# Patient Record
Sex: Male | Born: 1945
Health system: Southern US, Community
[De-identification: ages and names within clinical notes are randomized; demographics above are authoritative.]

---

## 2017-01-17 ENCOUNTER — Other Ambulatory Visit (HOSPITAL_COMMUNITY): Payer: Self-pay

## 2017-01-17 ENCOUNTER — Inpatient Hospital Stay
Admission: RE | Admit: 2017-01-17 | Discharge: 2017-02-27 | Disposition: A | Discharge status: Skilled Nursing Facility | Payer: Self-pay | Attending: Internal Medicine | Admitting: Internal Medicine

## 2017-01-17 DIAGNOSIS — Z30430 Encounter for insertion of intrauterine contraceptive device: Secondary | ICD-10-CM

## 2017-01-17 DIAGNOSIS — Z4659 Encounter for fitting and adjustment of other gastrointestinal appliance and device: Secondary | ICD-10-CM

## 2017-01-17 DIAGNOSIS — Z0189 Encounter for other specified special examinations: Secondary | ICD-10-CM

## 2017-01-17 DIAGNOSIS — R042 Hemoptysis: Secondary | ICD-10-CM

## 2017-01-17 DIAGNOSIS — J80 Acute respiratory distress syndrome: Secondary | ICD-10-CM

## 2017-01-17 DIAGNOSIS — J9690 Respiratory failure, unspecified, unspecified whether with hypoxia or hypercapnia: Secondary | ICD-10-CM

## 2017-01-17 DIAGNOSIS — J189 Pneumonia, unspecified organism: Secondary | ICD-10-CM

## 2017-01-17 DIAGNOSIS — K567 Ileus, unspecified: Secondary | ICD-10-CM

## 2017-01-17 DIAGNOSIS — Z978 Presence of other specified devices: Secondary | ICD-10-CM

## 2017-01-17 LAB — BLOOD GAS, ARTERIAL
ACID-BASE EXCESS: 4.9 mmol/L — AB (ref 0.0–2.0)
Bicarbonate: 28.4 mmol/L — ABNORMAL HIGH (ref 20.0–28.0)
FIO2: 40
O2 SAT: 89.7 %
PATIENT TEMPERATURE: 99.6
PH ART: 7.475 — AB (ref 7.350–7.450)
pCO2 arterial: 39.3 mmHg (ref 32.0–48.0)
pO2, Arterial: 60.9 mmHg — ABNORMAL LOW (ref 83.0–108.0)

## 2017-01-18 LAB — CBC WITH DIFFERENTIAL/PLATELET
BASOS PCT: 0 %
Basophils Absolute: 0 10*3/uL (ref 0.0–0.1)
EOS ABS: 0.2 10*3/uL (ref 0.0–0.7)
Eosinophils Relative: 2 %
HEMATOCRIT: 30 % — AB (ref 39.0–52.0)
HEMOGLOBIN: 9.4 g/dL — AB (ref 13.0–17.0)
Lymphocytes Relative: 5 %
Lymphs Abs: 0.6 10*3/uL — ABNORMAL LOW (ref 0.7–4.0)
MCH: 30.3 pg (ref 26.0–34.0)
MCHC: 31.3 g/dL (ref 30.0–36.0)
MCV: 96.8 fL (ref 78.0–100.0)
MONOS PCT: 10 %
Monocytes Absolute: 1.3 10*3/uL — ABNORMAL HIGH (ref 0.1–1.0)
NEUTROS ABS: 10.8 10*3/uL — AB (ref 1.7–7.7)
NEUTROS PCT: 83 %
Platelets: 308 10*3/uL (ref 150–400)
RBC: 3.1 MIL/uL — AB (ref 4.22–5.81)
RDW: 14.4 % (ref 11.5–15.5)
WBC: 12.9 10*3/uL — AB (ref 4.0–10.5)

## 2017-01-18 LAB — COMPREHENSIVE METABOLIC PANEL
ALK PHOS: 73 U/L (ref 38–126)
ALT: 97 U/L — ABNORMAL HIGH (ref 17–63)
ANION GAP: 5 (ref 5–15)
AST: 53 U/L — ABNORMAL HIGH (ref 15–41)
Albumin: 2.6 g/dL — ABNORMAL LOW (ref 3.5–5.0)
BUN: 21 mg/dL — ABNORMAL HIGH (ref 6–20)
CALCIUM: 8.3 mg/dL — AB (ref 8.9–10.3)
CHLORIDE: 107 mmol/L (ref 101–111)
CO2: 27 mmol/L (ref 22–32)
CREATININE: 1.08 mg/dL (ref 0.61–1.24)
Glucose, Bld: 143 mg/dL — ABNORMAL HIGH (ref 65–99)
Potassium: 4.3 mmol/L (ref 3.5–5.1)
SODIUM: 139 mmol/L (ref 135–145)
Total Bilirubin: 0.8 mg/dL (ref 0.3–1.2)
Total Protein: 5.4 g/dL — ABNORMAL LOW (ref 6.5–8.1)

## 2017-01-19 LAB — HEMOGLOBIN A1C
Hgb A1c MFr Bld: 5.5 % (ref 4.8–5.6)
Mean Plasma Glucose: 111 mg/dL

## 2017-01-21 LAB — BLOOD GAS, ARTERIAL
ACID-BASE EXCESS: 4.4 mmol/L — AB (ref 0.0–2.0)
BICARBONATE: 28.2 mmol/L — AB (ref 20.0–28.0)
FIO2: 35
O2 Saturation: 96.5 %
PCO2 ART: 41 mmHg (ref 32.0–48.0)
Patient temperature: 98.6
pH, Arterial: 7.452 — ABNORMAL HIGH (ref 7.350–7.450)
pO2, Arterial: 84.2 mmHg (ref 83.0–108.0)

## 2017-01-22 LAB — CULTURE, RESPIRATORY

## 2017-01-22 LAB — CULTURE, RESPIRATORY W GRAM STAIN: Culture: NORMAL

## 2017-01-23 ENCOUNTER — Other Ambulatory Visit (HOSPITAL_COMMUNITY): Payer: Self-pay

## 2017-01-23 LAB — GRAM STAIN

## 2017-01-23 LAB — BASIC METABOLIC PANEL
Anion gap: 10 (ref 5–15)
BUN: 14 mg/dL (ref 6–20)
CHLORIDE: 106 mmol/L (ref 101–111)
CO2: 23 mmol/L (ref 22–32)
Calcium: 8.6 mg/dL — ABNORMAL LOW (ref 8.9–10.3)
Creatinine, Ser: 1.22 mg/dL (ref 0.61–1.24)
GFR calc non Af Amer: 58 mL/min — ABNORMAL LOW (ref >60)
GLUCOSE: 107 mg/dL — AB (ref 65–99)
Potassium: 3.8 mmol/L (ref 3.5–5.1)
Sodium: 139 mmol/L (ref 135–145)

## 2017-01-23 LAB — CBC WITH DIFFERENTIAL/PLATELET
BASOS ABS: 0 10*3/uL (ref 0.0–0.1)
BASOS PCT: 1 %
Eosinophils Absolute: 0.2 10*3/uL (ref 0.0–0.7)
Eosinophils Relative: 2 %
HEMATOCRIT: 33 % — AB (ref 39.0–52.0)
HEMOGLOBIN: 10.2 g/dL — AB (ref 13.0–17.0)
LYMPHS PCT: 8 %
Lymphs Abs: 0.7 10*3/uL (ref 0.7–4.0)
MCH: 29.7 pg (ref 26.0–34.0)
MCHC: 30.9 g/dL (ref 30.0–36.0)
MCV: 95.9 fL (ref 78.0–100.0)
MONOS PCT: 10 %
Monocytes Absolute: 0.8 10*3/uL (ref 0.1–1.0)
NEUTROS ABS: 7 10*3/uL (ref 1.7–7.7)
NEUTROS PCT: 79 %
Platelets: 276 10*3/uL (ref 150–400)
RBC: 3.44 MIL/uL — ABNORMAL LOW (ref 4.22–5.81)
RDW: 14.3 % (ref 11.5–15.5)
WBC: 8.7 10*3/uL (ref 4.0–10.5)

## 2017-01-25 ENCOUNTER — Other Ambulatory Visit (HOSPITAL_COMMUNITY): Payer: Self-pay

## 2017-01-25 LAB — ACID FAST SMEAR (AFB, MYCOBACTERIA): Acid Fast Smear: NEGATIVE

## 2017-01-25 LAB — ACID FAST SMEAR (AFB)

## 2017-01-27 ENCOUNTER — Other Ambulatory Visit (HOSPITAL_COMMUNITY): Payer: Self-pay

## 2017-01-30 ENCOUNTER — Other Ambulatory Visit (HOSPITAL_COMMUNITY): Payer: Self-pay

## 2017-01-31 ENCOUNTER — Other Ambulatory Visit (HOSPITAL_COMMUNITY): Payer: Self-pay

## 2017-01-31 LAB — BASIC METABOLIC PANEL
Anion gap: 10 (ref 5–15)
BUN: 18 mg/dL (ref 6–20)
CALCIUM: 8.8 mg/dL — AB (ref 8.9–10.3)
CHLORIDE: 100 mmol/L — AB (ref 101–111)
CO2: 29 mmol/L (ref 22–32)
Creatinine, Ser: 1.07 mg/dL (ref 0.61–1.24)
GFR calc Af Amer: >60 mL/min (ref >60)
Glucose, Bld: 107 mg/dL — ABNORMAL HIGH (ref 65–99)
POTASSIUM: 3.3 mmol/L — AB (ref 3.5–5.1)
Sodium: 139 mmol/L (ref 135–145)

## 2017-02-01 ENCOUNTER — Other Ambulatory Visit (HOSPITAL_COMMUNITY): Payer: Self-pay

## 2017-02-02 LAB — CBC WITH DIFFERENTIAL/PLATELET
BASOS ABS: 0 10*3/uL (ref 0.0–0.1)
Basophils Relative: 0 %
EOS ABS: 0.4 10*3/uL (ref 0.0–0.7)
EOS PCT: 5 %
HCT: 36.8 % — ABNORMAL LOW (ref 39.0–52.0)
Hemoglobin: 12 g/dL — ABNORMAL LOW (ref 13.0–17.0)
LYMPHS PCT: 8 %
Lymphs Abs: 0.6 10*3/uL — ABNORMAL LOW (ref 0.7–4.0)
MCH: 30.6 pg (ref 26.0–34.0)
MCHC: 32.6 g/dL (ref 30.0–36.0)
MCV: 93.9 fL (ref 78.0–100.0)
MONO ABS: 1.2 10*3/uL — AB (ref 0.1–1.0)
Monocytes Relative: 18 %
Neutro Abs: 4.6 10*3/uL (ref 1.7–7.7)
Neutrophils Relative %: 69 %
PLATELETS: 212 10*3/uL (ref 150–400)
RBC: 3.92 MIL/uL — AB (ref 4.22–5.81)
RDW: 14.2 % (ref 11.5–15.5)
WBC: 6.8 10*3/uL (ref 4.0–10.5)

## 2017-02-02 LAB — BASIC METABOLIC PANEL
Anion gap: 7 (ref 5–15)
BUN: 25 mg/dL — AB (ref 6–20)
CALCIUM: 8.8 mg/dL — AB (ref 8.9–10.3)
CO2: 31 mmol/L (ref 22–32)
CREATININE: 1.02 mg/dL (ref 0.61–1.24)
Chloride: 101 mmol/L (ref 101–111)
GFR calc Af Amer: >60 mL/min (ref >60)
GLUCOSE: 159 mg/dL — AB (ref 65–99)
Potassium: 3.4 mmol/L — ABNORMAL LOW (ref 3.5–5.1)
SODIUM: 139 mmol/L (ref 135–145)

## 2017-02-05 LAB — CULTURE, RESPIRATORY W GRAM STAIN

## 2017-02-05 LAB — CULTURE, RESPIRATORY

## 2017-02-09 ENCOUNTER — Other Ambulatory Visit (HOSPITAL_COMMUNITY): Payer: Self-pay

## 2017-02-09 LAB — CBC
HEMATOCRIT: 38.9 % — AB (ref 39.0–52.0)
HEMOGLOBIN: 12.4 g/dL — AB (ref 13.0–17.0)
MCH: 29.6 pg (ref 26.0–34.0)
MCHC: 31.9 g/dL (ref 30.0–36.0)
MCV: 92.8 fL (ref 78.0–100.0)
Platelets: 255 10*3/uL (ref 150–400)
RBC: 4.19 MIL/uL — ABNORMAL LOW (ref 4.22–5.81)
RDW: 14.1 % (ref 11.5–15.5)
WBC: 11.3 10*3/uL — ABNORMAL HIGH (ref 4.0–10.5)

## 2017-02-09 LAB — BASIC METABOLIC PANEL WITH GFR
Anion gap: 10 (ref 5–15)
BUN: 32 mg/dL — ABNORMAL HIGH (ref 6–20)
CO2: 31 mmol/L (ref 22–32)
Calcium: 9.3 mg/dL (ref 8.9–10.3)
Chloride: 101 mmol/L (ref 101–111)
Creatinine, Ser: 1.38 mg/dL — ABNORMAL HIGH (ref 0.61–1.24)
GFR calc Af Amer: 58 mL/min — ABNORMAL LOW
GFR calc non Af Amer: 50 mL/min — ABNORMAL LOW
Glucose, Bld: 118 mg/dL — ABNORMAL HIGH (ref 65–99)
Potassium: 4.2 mmol/L (ref 3.5–5.1)
Sodium: 142 mmol/L (ref 135–145)

## 2017-02-12 ENCOUNTER — Other Ambulatory Visit (HOSPITAL_COMMUNITY): Payer: Self-pay

## 2017-02-16 LAB — CBC
HEMATOCRIT: 40.1 % (ref 39.0–52.0)
HEMOGLOBIN: 12.7 g/dL — AB (ref 13.0–17.0)
MCH: 29.1 pg (ref 26.0–34.0)
MCHC: 31.7 g/dL (ref 30.0–36.0)
MCV: 92 fL (ref 78.0–100.0)
Platelets: 281 10*3/uL (ref 150–400)
RBC: 4.36 MIL/uL (ref 4.22–5.81)
RDW: 14.6 % (ref 11.5–15.5)
WBC: 10.3 10*3/uL (ref 4.0–10.5)

## 2017-02-16 LAB — BASIC METABOLIC PANEL
Anion gap: 11 (ref 5–15)
BUN: 46 mg/dL — ABNORMAL HIGH (ref 6–20)
CHLORIDE: 105 mmol/L (ref 101–111)
CO2: 28 mmol/L (ref 22–32)
Calcium: 9.2 mg/dL (ref 8.9–10.3)
Creatinine, Ser: 1.47 mg/dL — ABNORMAL HIGH (ref 0.61–1.24)
GFR calc non Af Amer: 46 mL/min — ABNORMAL LOW (ref >60)
GFR, EST AFRICAN AMERICAN: 54 mL/min — AB (ref >60)
Glucose, Bld: 95 mg/dL (ref 65–99)
Potassium: 3.8 mmol/L (ref 3.5–5.1)
SODIUM: 144 mmol/L (ref 135–145)

## 2017-02-19 LAB — CBC
HCT: 37.8 % — ABNORMAL LOW (ref 39.0–52.0)
Hemoglobin: 11.9 g/dL — ABNORMAL LOW (ref 13.0–17.0)
MCH: 29 pg (ref 26.0–34.0)
MCHC: 31.5 g/dL (ref 30.0–36.0)
MCV: 92.2 fL (ref 78.0–100.0)
PLATELETS: 274 10*3/uL (ref 150–400)
RBC: 4.1 MIL/uL — AB (ref 4.22–5.81)
RDW: 14.8 % (ref 11.5–15.5)
WBC: 10.6 10*3/uL — ABNORMAL HIGH (ref 4.0–10.5)

## 2017-02-19 LAB — BASIC METABOLIC PANEL
Anion gap: 10 (ref 5–15)
BUN: 60 mg/dL — AB (ref 6–20)
CO2: 26 mmol/L (ref 22–32)
Calcium: 8.9 mg/dL (ref 8.9–10.3)
Chloride: 107 mmol/L (ref 101–111)
Creatinine, Ser: 1.62 mg/dL — ABNORMAL HIGH (ref 0.61–1.24)
GFR calc Af Amer: 48 mL/min — ABNORMAL LOW (ref >60)
GFR, EST NON AFRICAN AMERICAN: 41 mL/min — AB (ref >60)
GLUCOSE: 98 mg/dL (ref 65–99)
POTASSIUM: 3.5 mmol/L (ref 3.5–5.1)
Sodium: 143 mmol/L (ref 135–145)

## 2017-02-19 LAB — FUNGUS CULTURE WITH STAIN

## 2017-02-19 LAB — FUNGAL ORGANISM REFLEX

## 2017-02-19 LAB — FUNGUS CULTURE RESULT

## 2017-02-20 ENCOUNTER — Other Ambulatory Visit (HOSPITAL_COMMUNITY): Payer: Self-pay

## 2017-02-20 LAB — CK TOTAL AND CKMB (NOT AT ARMC)
CK, MB: 3.6 ng/mL (ref 0.5–5.0)
RELATIVE INDEX: INVALID (ref 0.0–2.5)
Total CK: 44 U/L — ABNORMAL LOW (ref 49–397)

## 2017-02-20 LAB — BLOOD GAS, ARTERIAL
Acid-base deficit: 2.8 mmol/L — ABNORMAL HIGH (ref 0.0–2.0)
BICARBONATE: 21.3 mmol/L (ref 20.0–28.0)
FIO2: 100
O2 SAT: 83.6 %
PATIENT TEMPERATURE: 98.6
PCO2 ART: 36 mmHg (ref 32.0–48.0)
PO2 ART: 50.7 mmHg — AB (ref 83.0–108.0)
pH, Arterial: 7.391 (ref 7.350–7.450)

## 2017-02-20 LAB — TROPONIN I: TROPONIN I: 0.91 ng/mL — AB (ref <0.03)

## 2017-02-21 ENCOUNTER — Other Ambulatory Visit (HOSPITAL_COMMUNITY): Payer: Self-pay

## 2017-02-21 LAB — BASIC METABOLIC PANEL
Anion gap: 12 (ref 5–15)
BUN: 48 mg/dL — AB (ref 6–20)
CALCIUM: 8.8 mg/dL — AB (ref 8.9–10.3)
CO2: 24 mmol/L (ref 22–32)
Chloride: 105 mmol/L (ref 101–111)
Creatinine, Ser: 1.32 mg/dL — ABNORMAL HIGH (ref 0.61–1.24)
GFR calc Af Amer: >60 mL/min (ref >60)
GFR, EST NON AFRICAN AMERICAN: 53 mL/min — AB (ref >60)
GLUCOSE: 120 mg/dL — AB (ref 65–99)
Potassium: 3.6 mmol/L (ref 3.5–5.1)
Sodium: 141 mmol/L (ref 135–145)

## 2017-02-21 LAB — CBC
HCT: 38.1 % — ABNORMAL LOW (ref 39.0–52.0)
Hemoglobin: 12 g/dL — ABNORMAL LOW (ref 13.0–17.0)
MCH: 28.7 pg (ref 26.0–34.0)
MCHC: 31.5 g/dL (ref 30.0–36.0)
MCV: 91.1 fL (ref 78.0–100.0)
PLATELETS: 201 10*3/uL (ref 150–400)
RBC: 4.18 MIL/uL — ABNORMAL LOW (ref 4.22–5.81)
RDW: 14.7 % (ref 11.5–15.5)
WBC: 10.1 10*3/uL (ref 4.0–10.5)

## 2017-02-22 ENCOUNTER — Other Ambulatory Visit (HOSPITAL_COMMUNITY): Payer: Self-pay

## 2017-02-23 ENCOUNTER — Other Ambulatory Visit (HOSPITAL_BASED_OUTPATIENT_CLINIC_OR_DEPARTMENT_OTHER): Payer: Self-pay

## 2017-02-23 DIAGNOSIS — I361 Nonrheumatic tricuspid (valve) insufficiency: Secondary | ICD-10-CM

## 2017-02-23 LAB — VANCOMYCIN, TROUGH: Vancomycin Tr: 17 ug/mL (ref 15–20)

## 2017-02-23 NOTE — Progress Notes (Signed)
  Echocardiogram 2D Echocardiogram has been performed.  Eric Chandler 02/23/2017, 4:08 PM

## 2017-02-25 LAB — CULTURE, BLOOD (ROUTINE X 2)
Culture: NO GROWTH
Culture: NO GROWTH
SPECIAL REQUESTS: ADEQUATE
Special Requests: ADEQUATE

## 2017-02-25 LAB — CBC
HCT: 35.8 % — ABNORMAL LOW (ref 39.0–52.0)
HEMOGLOBIN: 11.3 g/dL — AB (ref 13.0–17.0)
MCH: 29 pg (ref 26.0–34.0)
MCHC: 31.6 g/dL (ref 30.0–36.0)
MCV: 92 fL (ref 78.0–100.0)
PLATELETS: 171 10*3/uL (ref 150–400)
RBC: 3.89 MIL/uL — AB (ref 4.22–5.81)
RDW: 14.5 % (ref 11.5–15.5)
WBC: 6.7 10*3/uL (ref 4.0–10.5)

## 2017-02-25 LAB — BASIC METABOLIC PANEL
ANION GAP: 6 (ref 5–15)
BUN: 16 mg/dL (ref 6–20)
CO2: 25 mmol/L (ref 22–32)
Calcium: 8.5 mg/dL — ABNORMAL LOW (ref 8.9–10.3)
Chloride: 109 mmol/L (ref 101–111)
Creatinine, Ser: 0.86 mg/dL (ref 0.61–1.24)
Glucose, Bld: 88 mg/dL (ref 65–99)
POTASSIUM: 3.7 mmol/L (ref 3.5–5.1)
SODIUM: 140 mmol/L (ref 135–145)

## 2017-02-26 LAB — CULTURE, RESPIRATORY W GRAM STAIN: Culture: NORMAL

## 2017-02-26 LAB — CULTURE, RESPIRATORY

## 2017-03-09 LAB — ACID FAST CULTURE WITH REFLEXED SENSITIVITIES (MYCOBACTERIA): Acid Fast Culture: NEGATIVE

## 2017-03-09 LAB — ACID FAST CULTURE WITH REFLEXED SENSITIVITIES

## 2018-05-02 IMAGING — DX DG ABD PORTABLE 1V
1 series · 1 of 1 positions shown · non-contrast
Comparison: 01/23/2017

CLINICAL DATA: NG tube placement.

EXAM:
PORTABLE ABDOMEN - 1 VIEW

[abdomen kub]
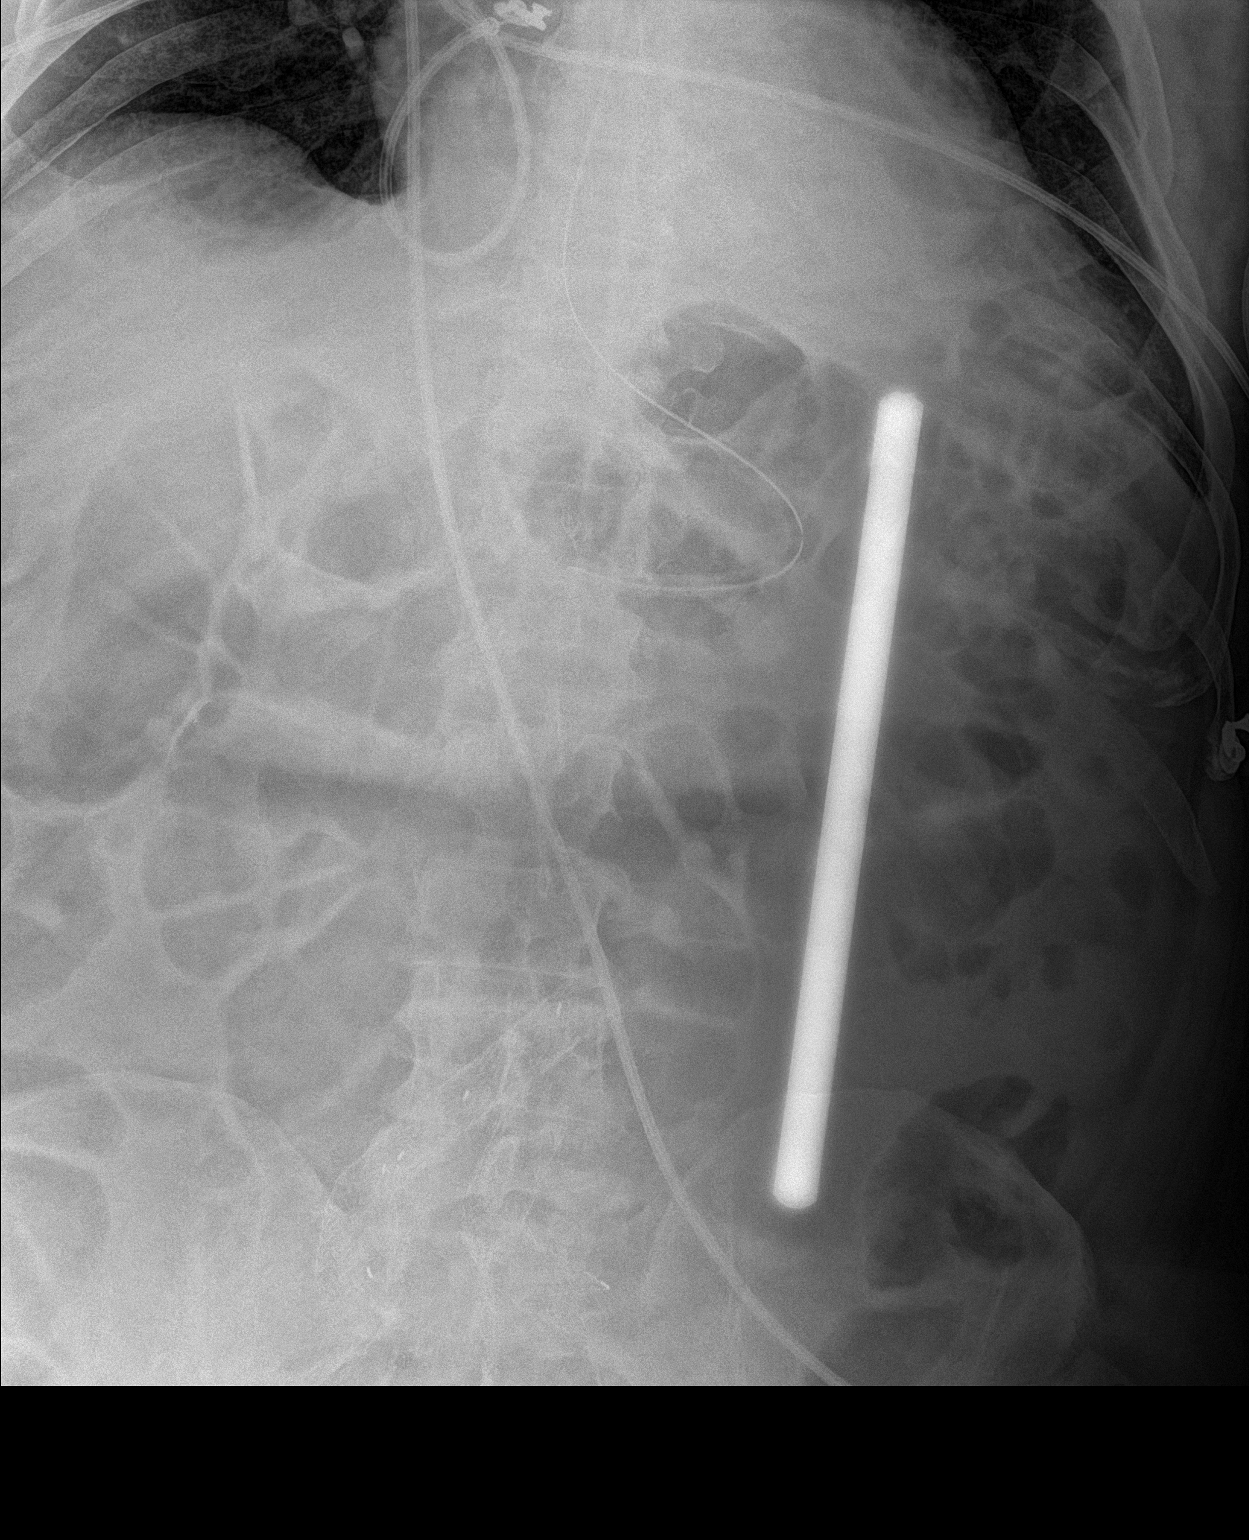

[1 of 1 positions shown; findings below may reference images not displayed]

FINDINGS: Previous feeding tube has been removed. An enteric tube has been
placed with tip in the upper mid abdomen consistent with location in
the mid/distal body of the stomach. Scattered gas-filled small and
large bowel without distention, likely representing ileus. Vascular
stents. Foreign body projected over the left abdomen likely
representing artifact.
IMPRESSION: Enteric tube tip is in the upper mid abdomen consistent with
location in the mid/distal body of the stomach.

## 2018-05-05 IMAGING — DX DG CHEST 1V PORT
1 series · 1 of 1 positions shown · non-contrast
Comparison: Portable chest x-ray January 25, 2017

CLINICAL DATA: Onset of shortness of breath today. Clinical
diagnosis of pneumonia. Nonsmoker.

EXAM:
PORTABLE CHEST 1 VIEW

[chest]
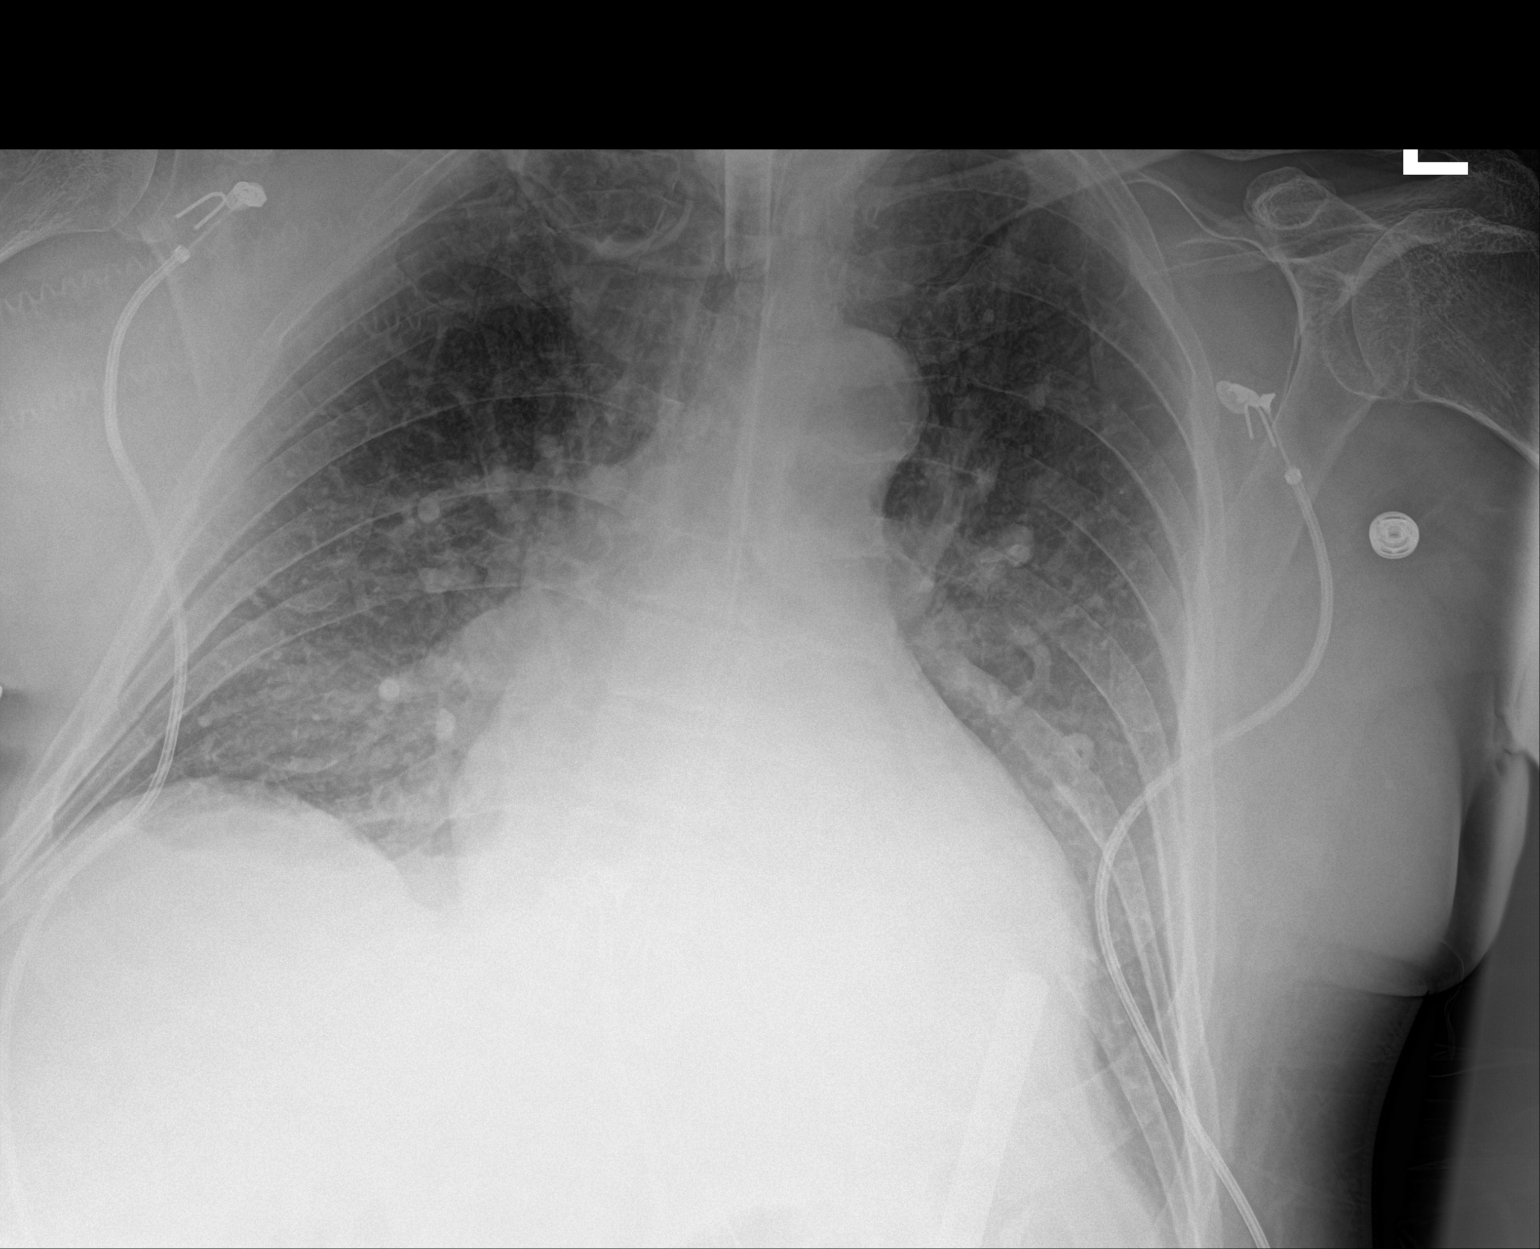

[1 of 1 positions shown; findings below may reference images not displayed]

FINDINGS: The lungs are adequately inflated. The cardiac silhouette is
enlarged. The pulmonary vascularity is engorged. The interstitial
markings are increased. The retrocardiac region is slightly less
dense today. The tracheostomy tube tip projects at the inferior
margin of the clavicular heads. The esophagogastric tube tip
projects below the inferior margin of the image. There is
calcification in the wall of the aortic arch. The bony thorax
exhibits no acute abnormality.
IMPRESSION: CHF with mild pulmonary interstitial edema similar to that seen
previously. I cannot exclude left lower lobe atelectasis or
pneumonia.

Thoracic aortic atherosclerosis.

## 2018-05-07 IMAGING — DX DG ABD PORTABLE 1V
1 series · 1 of 1 positions shown · non-contrast
Comparison: 01/30/2017

CLINICAL DATA: NG tube placement

EXAM:
PORTABLE ABDOMEN - 1 VIEW

[abdomen kub]
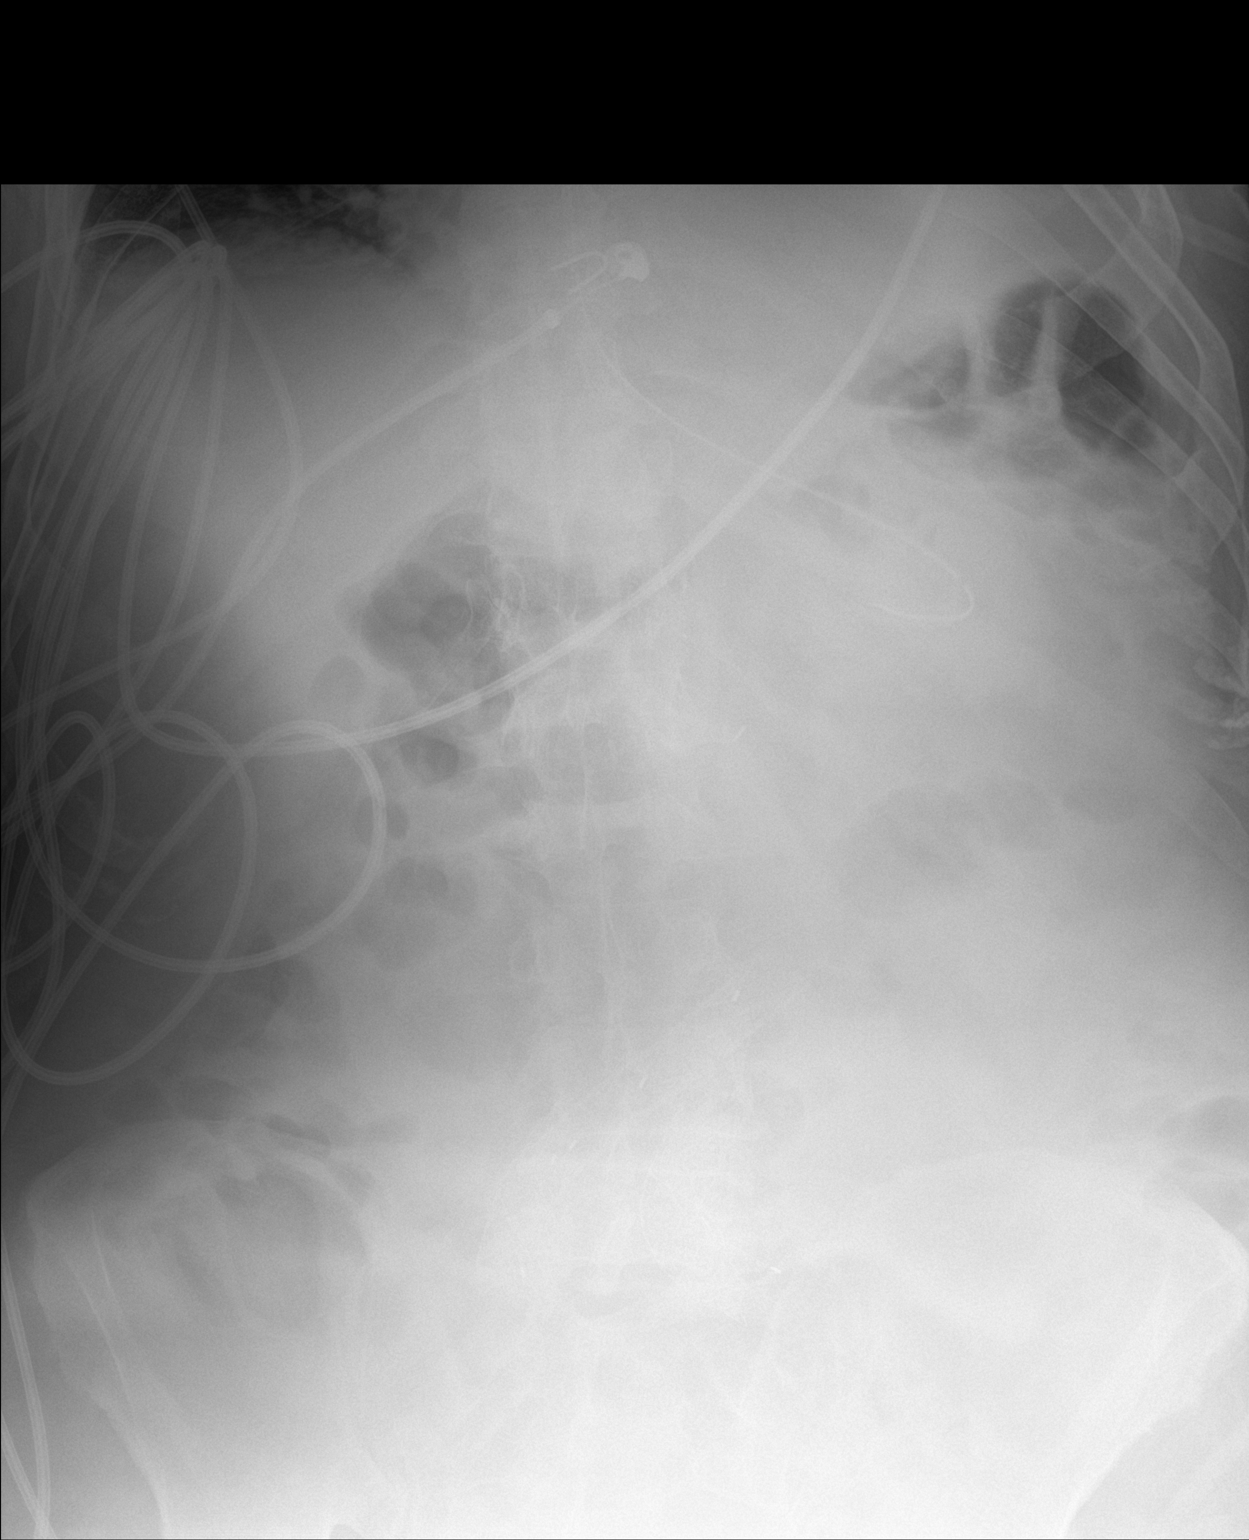

[1 of 1 positions shown; findings below may reference images not displayed]

FINDINGS: NG tube is in the mid stomach. Nonobstructive bowel gas pattern. No
free air organomegaly.
IMPRESSION: NG tube tip in the mid stomach.
